# Patient Record
Sex: Male | Born: 1990 | Race: Black or African American | Hispanic: No | Marital: Single | Smoking: Current every day smoker
Health system: Southern US, Community
[De-identification: ages and names within clinical notes are randomized; demographics above are authoritative.]

---

## 2014-08-07 ENCOUNTER — Encounter (HOSPITAL_BASED_OUTPATIENT_CLINIC_OR_DEPARTMENT_OTHER): Payer: Self-pay | Admitting: *Deleted

## 2014-08-07 ENCOUNTER — Emergency Department (HOSPITAL_BASED_OUTPATIENT_CLINIC_OR_DEPARTMENT_OTHER)
Admission: EM | Admit: 2014-08-07 | Discharge: 2014-08-07 | Disposition: A | Discharge status: Home / Self Care | Payer: Self-pay | Attending: Emergency Medicine | Admitting: Emergency Medicine

## 2014-08-07 ENCOUNTER — Emergency Department (HOSPITAL_BASED_OUTPATIENT_CLINIC_OR_DEPARTMENT_OTHER): Payer: Self-pay

## 2014-08-07 DIAGNOSIS — K5900 Constipation, unspecified: Secondary | ICD-10-CM | POA: Insufficient documentation

## 2014-08-07 DIAGNOSIS — Z720 Tobacco use: Secondary | ICD-10-CM | POA: Insufficient documentation

## 2014-08-07 DIAGNOSIS — K920 Hematemesis: Secondary | ICD-10-CM | POA: Insufficient documentation

## 2014-08-07 DIAGNOSIS — R109 Unspecified abdominal pain: Secondary | ICD-10-CM

## 2014-08-07 LAB — CBC WITH DIFFERENTIAL/PLATELET
Basophils Absolute: 0 10*3/uL (ref 0.0–0.1)
Basophils Relative: 1 % (ref 0–1)
EOS PCT: 1 % (ref 0–5)
Eosinophils Absolute: 0 10*3/uL (ref 0.0–0.7)
HCT: 49.9 % (ref 39.0–52.0)
Hemoglobin: 17.6 g/dL — ABNORMAL HIGH (ref 13.0–17.0)
LYMPHS PCT: 31 % (ref 12–46)
Lymphs Abs: 1.7 10*3/uL (ref 0.7–4.0)
MCH: 29.4 pg (ref 26.0–34.0)
MCHC: 35.3 g/dL (ref 30.0–36.0)
MCV: 83.3 fL (ref 78.0–100.0)
Monocytes Absolute: 0.3 10*3/uL (ref 0.1–1.0)
Monocytes Relative: 6 % (ref 3–12)
Neutro Abs: 3.5 10*3/uL (ref 1.7–7.7)
Neutrophils Relative %: 61 % (ref 43–77)
PLATELETS: 221 10*3/uL (ref 150–400)
RBC: 5.99 MIL/uL — AB (ref 4.22–5.81)
RDW: 13.5 % (ref 11.5–15.5)
WBC: 5.6 10*3/uL (ref 4.0–10.5)

## 2014-08-07 LAB — COMPREHENSIVE METABOLIC PANEL
ALK PHOS: 76 U/L (ref 39–117)
ALT: 22 U/L (ref 0–53)
ANION GAP: 7 (ref 5–15)
AST: 23 U/L (ref 0–37)
Albumin: 4.8 g/dL (ref 3.5–5.2)
BUN: 7 mg/dL (ref 6–23)
CO2: 26 mmol/L (ref 19–32)
Calcium: 9.4 mg/dL (ref 8.4–10.5)
Chloride: 107 mmol/L (ref 96–112)
Creatinine, Ser: 0.84 mg/dL (ref 0.50–1.35)
GFR calc Af Amer: >90 mL/min (ref >90)
GLUCOSE: 104 mg/dL — AB (ref 70–99)
POTASSIUM: 4.1 mmol/L (ref 3.5–5.1)
SODIUM: 140 mmol/L (ref 135–145)
Total Bilirubin: 1.2 mg/dL (ref 0.3–1.2)
Total Protein: 8.4 g/dL — ABNORMAL HIGH (ref 6.0–8.3)

## 2014-08-07 LAB — LIPASE, BLOOD: Lipase: 28 U/L (ref 11–59)

## 2014-08-07 MED ORDER — PANTOPRAZOLE SODIUM 20 MG PO TBEC: 20.0000 mg | DELAYED_RELEASE_TABLET | Freq: Every day | ORAL | Status: AC

## 2014-08-07 MED ORDER — PANTOPRAZOLE SODIUM 40 MG IV SOLR
40.0000 mg | Freq: Once | INTRAVENOUS | Status: AC
  Administered 2014-08-07: 40 mg via INTRAVENOUS
  Filled 2014-08-07: qty 40

## 2014-08-07 MED ORDER — SODIUM CHLORIDE 0.9 % IV BOLUS (SEPSIS)
1000.0000 mL | Freq: Once | INTRAVENOUS | Status: AC
  Administered 2014-08-07: 1000 mL via INTRAVENOUS

## 2014-08-07 MED ORDER — GI COCKTAIL ~~LOC~~
30.0000 mL | Freq: Once | ORAL | Status: AC
  Administered 2014-08-07: 30 mL via ORAL
  Filled 2014-08-07: qty 30

## 2014-08-07 MED ORDER — SUCRALFATE 1 G PO TABS: 1.0000 g | ORAL_TABLET | Freq: Three times a day (TID) | ORAL | Status: AC

## 2014-08-07 NOTE — ED Notes (Addendum)
Pt reports vomiting bright red blood and epigastric pain x 2 weeks.  Constipation x 2 weeks.  LBM 'about 2 weeks ago'

## 2014-08-07 NOTE — ED Provider Notes (Signed)
CSN: [id number]     Arrival date & time 08/07/14  1043 History   First MD Initiated Contact with Patient 08/07/14 1051     Chief Complaint  Patient presents with  . Hematemesis     (Consider location/radiation/quality/duration/timing/severity/associated sxs/prior Treatment) HPI Tyler Gentry is a 24 y.o. male with history of acid reflux who presents to emergency department complaining of left upper quadrant abdominal pain, nausea, hematemesis, constipation. Patient states his symptoms started 2 weeks ago. States pain is usually worse in the morning. He states he has episode of emesis every single morning independent of eating. States every day he sees bright red blood in his emesis, states "it's a lot of blood and sometimes chunks." He also states he has not had a bowel movement in these 2 weeks. He states that sometimes the pain is completely gone, but most of the time it is there and it is worsening every day. He states he tried to take a laxative, states it was apparent that he got over-the-counter, which did not help him have a bowel movement. He denies taking for pain. He denies taking any for acid reflux. States the pain is worsened with eating, states he has not been eating but maybe once a day for the last several weeks because of the pain and nausea. Patient denies changing his diet. He denies any spicy foods. Denies taking NSAID medications. He is a smoker. Only occasional alcohol  History reviewed. No pertinent past medical history. History reviewed. No pertinent past surgical history. History reviewed. No pertinent family history. History  Substance Use Topics  . Smoking status: Current Every Day Smoker -- 0.50 packs/day    Types: Cigarettes  . Smokeless tobacco: Not on file  . Alcohol Use: No    Review of Systems  Constitutional: Negative for fever and chills.  Respiratory: Negative for cough, chest tightness and shortness of breath.   Cardiovascular: Negative for chest  pain, palpitations and leg swelling.  Gastrointestinal: Positive for nausea, vomiting, abdominal pain and constipation. Negative for diarrhea, blood in stool and abdominal distention.  Genitourinary: Negative for dysuria, urgency, frequency and hematuria.  Musculoskeletal: Negative for myalgias, arthralgias, neck pain and neck stiffness.  Skin: Negative for rash.  Allergic/Immunologic: Negative for immunocompromised state.  Neurological: Negative for dizziness, weakness, light-headedness, numbness and headaches.  All other systems reviewed and are negative.     Allergies  Review of patient's allergies indicates no known allergies.  Home Medications   Prior to Admission medications   Not on File   BP 150/83 mmHg  Pulse 62  Temp(Src) 98.7 F (37.1 C) (Oral)  Resp 18  Ht 6\' 2"  (1.88 m)  Wt 235 lb (106.595 kg)  BMI 30.16 kg/m2  SpO2 100% Physical Exam  Constitutional: He appears well-developed and well-nourished. No distress.  HENT:  Head: Normocephalic and atraumatic.  Eyes: Conjunctivae are normal.  Neck: Neck supple.  Cardiovascular: Normal rate, regular rhythm and normal heart sounds.   Pulmonary/Chest: Effort normal. No respiratory distress. He has no wheezes. He has no rales.  Abdominal: Soft. Bowel sounds are normal. He exhibits no distension. There is tenderness. There is no rebound and no guarding.  LUQ tenderness  Musculoskeletal: He exhibits no edema.  Neurological: He is alert.  Skin: Skin is warm and dry.  Nursing note and vitals reviewed.   ED Course  Procedures (including critical care time) Labs Review Labs Reviewed  CBC WITH DIFFERENTIAL/PLATELET - Abnormal; Notable for the following:    RBC 5.99 (*)  Hemoglobin 17.6 (*)    All other components within normal limits  COMPREHENSIVE METABOLIC PANEL - Abnormal; Notable for the following:    Glucose, Bld 104 (*)    Total Protein 8.4 (*)    All other components within normal limits  LIPASE, BLOOD     Imaging Review Dg Abd 2 Views  08/07/2014   CLINICAL DATA:  Hematemesis. Nausea and vomiting. Left upper quadrant abdominal pain.  EXAM: ABDOMEN - 2 VIEW  COMPARISON:  None.  FINDINGS: The bowel gas pattern is normal. There is no evidence of free air. No radio-opaque calculi or other significant radiographic abnormality is seen.  IMPRESSION: Negative.   Electronically Signed   By: Gaylyn Rong M.D.   On: 08/07/2014 13:16     EKG Interpretation None      MDM   Final diagnoses:  Abdominal pain   patient is otherwise healthy 24 year old, here with epigastric pain, hematemesis for the last 2 weeks. He also complaining of constipation, no bowel movement for 2 weeks. Patient's exam is consistent with gastritis versus PUD. His abdomen is soft, no guarding, doubt surgical abdomen. We will check labs including H&H, lipase, LFTs. Will try GI cocktail and order Protonix.  2:49 PM Patient is feeling much better after GI cocktail protonic IV fluids. His labs unremarkable, H&H are normal. Normal LFTs and lipase. Abdomen is benign. At this time I think patient is stable for discharge home with no further imaging needed on an emergent basis. I will refer him to gastroenterologist. Protonix and Carafate. Started to stop smoking. No NSAIDs. Follow-up as soon as able. We'll ask for constipation. Return precautions discussed. Patient was able to tolerate by mouth fluids in emergency department.  Filed Vitals:   08/07/14 1048 08/07/14 1342  BP: 150/83 110/45  Pulse: 62 63  Temp: 98.7 F (37.1 C)   TempSrc: Oral   Resp: 18 17  Height:  (1.88 m)   Weight: 235 lb (106.595 kg)   SpO2: 100% 100%     Jaynie Crumble, PA-C 08/07/14 2234  Gerhard Munch, MD 08/14/14 [phone]

## 2014-08-07 NOTE — ED Notes (Signed)
Patient transported to X-ray 

## 2014-08-07 NOTE — Discharge Instructions (Signed)
Start taking Protonix daily. Take Carafate as needed. Avoid any spicy foods. Avoid any NSAID medications. Stop smoking. Follow-up with gastroenterologist as referred. You can also get Miralax sold over-the-counter and take it 1-2 times a day for constipation.  Return if symptoms are worsening.  Peptic Ulcer A peptic ulcer is a sore in the lining of your esophagus (esophageal ulcer), stomach (gastric ulcer), or in the first part of your small intestine (duodenal ulcer). The ulcer causes erosion into the deeper tissue. CAUSES  Normally, the lining of the stomach and the small intestine protects itself from the acid that digests food. The protective lining can be damaged by:  An infection caused by a bacterium called Helicobacter pylori (H. pylori).  Regular use of nonsteroidal anti-inflammatory drugs (NSAIDs), such as ibuprofen or aspirin.  Smoking tobacco. Other risk factors include being older than 50, drinking alcohol excessively, and having a family history of ulcer disease.  SYMPTOMS   Burning pain or gnawing in the area between the chest and the belly button.  Heartburn.  Nausea and vomiting.  Bloating. The pain can be worse on an empty stomach and at night. If the ulcer results in bleeding, it can cause:  Black, tarry stools.  Vomiting of bright red blood.  Vomiting of coffee-ground-looking materials. DIAGNOSIS  A diagnosis is usually made based upon your history and an exam. Other tests and procedures may be performed to find the cause of the ulcer. Finding a cause will help determine the best treatment. Tests and procedures may include:  Blood tests, stool tests, or breath tests to check for the bacterium H. pylori.  An upper gastrointestinal (GI) series of the esophagus, stomach, and small intestine.  An endoscopy to examine the esophagus, stomach, and small intestine.  A biopsy. TREATMENT  Treatment may include:  Eliminating the cause of the ulcer, such as  smoking, NSAIDs, or alcohol.  Medicines to reduce the amount of acid in your digestive tract.  Antibiotic medicines if the ulcer is caused by the H. pylori bacterium.  An upper endoscopy to treat a bleeding ulcer.  Surgery if the bleeding is severe or if the ulcer created a hole somewhere in the digestive system. HOME CARE INSTRUCTIONS   Avoid tobacco, alcohol, and caffeine. Smoking can increase the acid in the stomach, and continued smoking will impair the healing of ulcers.  Avoid foods and drinks that seem to cause discomfort or aggravate your ulcer.  Only take medicines as directed by your caregiver. Do not substitute over-the-counter medicines for prescription medicines without talking to your caregiver.  Keep any follow-up appointments and tests as directed. SEEK MEDICAL CARE IF:   Your do not improve within 7 days of starting treatment.  You have ongoing indigestion or heartburn. SEEK IMMEDIATE MEDICAL CARE IF:   You have sudden, sharp, or persistent abdominal pain.  You have bloody or dark black, tarry stools.  You vomit blood or vomit that looks like coffee grounds.  You become light-headed, weak, or feel faint.  You become sweaty or clammy. MAKE SURE YOU:   Understand these instructions.  Will watch your condition.  Will get help right away if you are not doing well or get worse. Document Released: 03/24/2000 Document Revised: 08/11/2013 Document Reviewed: 10/25/2011 [facility] Patient Information 2015 [place]. This information is not intended to replace advice given to you by your health care provider. Make sure you discuss any questions you have with your health care provider.  Constipation Constipation is when a person has  fewer than three bowel movements a week, has difficulty having a bowel movement, or has stools that are dry, hard, or larger than normal. As people grow older, constipation is more common. If you try to fix constipation with medicines  that make you have a bowel movement (laxatives), the problem may get worse. Long-term laxative use may cause the muscles of the colon to become weak. A low-fiber diet, not taking in enough fluids, and taking certain medicines may make constipation worse.  CAUSES   Certain medicines, such as antidepressants, pain medicine, iron supplements, antacids, and water pills.   Certain diseases, such as diabetes, irritable bowel syndrome (IBS), thyroid disease, or depression.   Not drinking enough water.   Not eating enough fiber-rich foods.   Stress or travel.   Lack of physical activity or exercise.   Ignoring the urge to have a bowel movement.   Using laxatives too much.  SIGNS AND SYMPTOMS   Having fewer than three bowel movements a week.   Straining to have a bowel movement.   Having stools that are hard, dry, or larger than normal.   Feeling full or bloated.   Pain in the lower abdomen.   Not feeling relief after having a bowel movement.  DIAGNOSIS  Your health care provider will take a medical history and perform a physical exam. Further testing may be done for severe constipation. Some tests may include:  A barium enema X-ray to examine your rectum, colon, and, sometimes, your small intestine.   A sigmoidoscopy to examine your lower colon.   A colonoscopy to examine your entire colon. TREATMENT  Treatment will depend on the severity of your constipation and what is causing it. Some dietary treatments include drinking more fluids and eating more fiber-rich foods. Lifestyle treatments may include regular exercise. If these diet and lifestyle recommendations do not help, your health care provider may recommend taking over-the-counter laxative medicines to help you have bowel movements. Prescription medicines may be prescribed if over-the-counter medicines do not work.  HOME CARE INSTRUCTIONS   Eat foods that have a lot of fiber, such as fruits, vegetables, whole  grains, and beans.  Limit foods high in fat and processed sugars, such as french fries, hamburgers, cookies, candies, and soda.   A fiber supplement may be added to your diet if you cannot get enough fiber from foods.   Drink enough fluids to keep your urine clear or pale yellow.   Exercise regularly or as directed by your health care provider.   Go to the restroom when you have the urge to go. Do not hold it.   Only take over-the-counter or prescription medicines as directed by your health care provider. Do not take other medicines for constipation without talking to your health care provider first.  SEEK IMMEDIATE MEDICAL CARE IF:   You have bright red blood in your stool.   Your constipation lasts for more than 4 days or gets worse.   You have abdominal or rectal pain.   You have thin, pencil-like stools.   You have unexplained weight loss. MAKE SURE YOU:   Understand these instructions.  Will watch your condition.  Will get help right away if you are not doing well or get worse. Document Released: 12/24/2003 Document Revised: 04/01/2013 Document Reviewed: 01/06/2013 [facility] Patient Information 2015 [place], [place]. This information is not intended to replace advice given to you by your health care provider. Make sure you discuss any questions you have with your health care  provider.

## 2018-12-13 ENCOUNTER — Ambulatory Visit (HOSPITAL_COMMUNITY)
Admission: EM | Admit: 2018-12-13 | Discharge: 2018-12-13 | Disposition: A | Discharge status: Home / Self Care | Payer: Self-pay | Attending: Family Medicine | Admitting: Family Medicine

## 2018-12-13 ENCOUNTER — Encounter (HOSPITAL_COMMUNITY): Payer: Self-pay | Admitting: Emergency Medicine

## 2018-12-13 DIAGNOSIS — H1031 Unspecified acute conjunctivitis, right eye: Secondary | ICD-10-CM

## 2018-12-13 MED ORDER — ERYTHROMYCIN 5 MG/GM OP OINT: 1.0000 "application " | TOPICAL_OINTMENT | Freq: Four times a day (QID) | OPHTHALMIC | 0 refills | Status: AC

## 2018-12-13 NOTE — ED Provider Notes (Signed)
[facility]    CSN: [id number] Arrival date & time: 12/13/18  1108      History   Chief Complaint Chief Complaint  Patient presents with  . Eye Problem    HPI Tyler OkQuaneDaris Hoe is a 28 y.o. male.   Tyler Gentry presents with complaints of right eye redness, tearing, photophobia and pain. No foreign body sensation. No injury. No chemical or exposure to the eye. Denies any previous similar. No headache. Vision feels blurred. No nausea or vomiting. No purulence. Doesn't wear contacts or glasses. Denies any previous similar. No URI symptoms. No itching.    ROS per HPI, negative if not otherwise mentioned.      History reviewed. No pertinent past medical history.  There are no active problems to display for this patient.   History reviewed. No pertinent surgical history.     Home Medications    Prior to Admission medications   Medication Sig Start Date End Date Taking? Authorizing Provider  erythromycin ophthalmic ointment Place 1 application into the right eye 4 (four) times daily for 7 days. 12/13/18 12/20/18  Georgetta HaberBurky, Natalie B, NP  pantoprazole (PROTONIX) 20 MG tablet Take 1 tablet (20 mg total) by mouth daily. 08/07/14   Kirichenko, Tatyana, PA-C  sucralfate (CARAFATE) 1 G tablet Take 1 tablet (1 g total) by mouth 4 (four) times daily -  with meals and at bedtime. 08/07/14   Jaynie CrumbleKirichenko, Tatyana, PA-C    Family History No family history on file.  Social History Social History   Tobacco Use  . Smoking status: Current Every Day Smoker    Packs/day: 0.50    Types: Cigarettes  Substance Use Topics  . Alcohol use: No  . Drug use: No     Allergies   Patient has no known allergies.   Review of Systems Review of Systems   Physical Exam Triage Vital Signs ED Triage Vitals [12/13/18 1124]  Enc Vitals Group     BP (!) 142/80     Pulse Rate (!) 58     Resp 16     Temp (!) 97 F (36.1 C)     Temp src      SpO2 100 %     Weight      Height       Head Circumference      Peak Flow      Pain Score 7     Pain Loc      Pain Edu?      Excl. in GC?    No data found.  Updated Vital Signs BP (!) 142/80   Pulse (!) 58   Temp (!) 97 F (36.1 C)   Resp 16   SpO2 100%   Visual Acuity Right Eye Distance: 20/200 Left Eye Distance: 20/70 Bilateral Distance: 20/70  Right Eye Near:   Left Eye Near:    Bilateral Near:     Physical Exam Constitutional:      Appearance: He is well-developed.  Eyes:     General: Lids are normal.        Right eye: No foreign body.     Extraocular Movements: Extraocular movements intact.     Conjunctiva/sclera:     Right eye: Right conjunctiva is injected.     Pupils: Pupils are equal, round, and reactive to light.     Right eye: Pupil is not sluggish. No fluorescein uptake.     Comments: Excess clear tearing from right eye; notable photophobia which improved s/p  tetracaine administration; grossly red; minimal pain with movement  Cardiovascular:     Rate and Rhythm: Normal rate.  Pulmonary:     Effort: Pulmonary effort is normal.  Skin:    General: Skin is warm and dry.  Neurological:     Mental Status: He is alert and oriented to person, place, and time.      UC Treatments / Results  Labs (all labs ordered are listed, but only abnormal results are displayed) Labs Reviewed - No data to display  EKG   Radiology No results found.  Procedures Procedures (including critical care time)  Medications Ordered in UC Medications - No data to display  Initial Impression / Assessment and Plan / UC Course  I have reviewed the triage vital signs and the nursing notes.  Pertinent labs & imaging results that were available during my care of the patient were reviewed by me and considered in my medical decision making (see chart for details).     Noted decreased acuity to right eye with photophobia. Symptoms did improve with tetracaine. Doesn't wear contacts. No fluorescein findings. No  headache or nausea. Erythromycin provided and encouraged follow up with eye today or tomorrow for recheck and further eval. Patient verbalized understanding and agreeable to plan.  Ambulatory out of clinic without difficulty.    Final Clinical Impressions(s) / UC Diagnoses   Final diagnoses:  Acute conjunctivitis of right eye, unspecified acute conjunctivitis type     Discharge Instructions     Use of eye ointment as prescribed.  Please follow up with an eye doctor, call today for recheck today or tomorrow Avoid rubbing the eye as able.  Any worsening of symptoms- worsening of pain, loss of vision or otherwise worsening please return or go to the ER.    ED Prescriptions    Medication Sig Dispense Auth. Provider   erythromycin ophthalmic ointment Place 1 application into the right eye 4 (four) times daily for 7 days. 28 g Augusto Gamble B, NP     Controlled Substance Prescriptions [place] Controlled Substance Registry consulted? Not Applicable   Zigmund Gottron, NP 12/13/18 1201

## 2018-12-13 NOTE — ED Triage Notes (Signed)
Pt states "I want to be sure I dont have pink eye". Pt states when the sun hits it, it hurts. Pt c/o drainage.

## 2018-12-13 NOTE — Discharge Instructions (Addendum)
Use of eye ointment as prescribed.  Please follow up with an eye doctor, call today for recheck today or tomorrow Avoid rubbing the eye as able.  Any worsening of symptoms- worsening of pain, loss of vision or otherwise worsening please return or go to the ER.

## 2019-12-29 ENCOUNTER — Emergency Department (HOSPITAL_COMMUNITY)
Admission: EM | Admit: 2019-12-29 | Discharge: 2019-12-30 | Disposition: A | Discharge status: Home / Self Care | Payer: Self-pay | Attending: Emergency Medicine | Admitting: Emergency Medicine

## 2019-12-29 ENCOUNTER — Other Ambulatory Visit: Payer: Self-pay

## 2019-12-29 ENCOUNTER — Emergency Department (HOSPITAL_COMMUNITY): Payer: Self-pay

## 2019-12-29 ENCOUNTER — Encounter (HOSPITAL_COMMUNITY): Payer: Self-pay | Admitting: Emergency Medicine

## 2019-12-29 DIAGNOSIS — F17210 Nicotine dependence, cigarettes, uncomplicated: Secondary | ICD-10-CM | POA: Insufficient documentation

## 2019-12-29 DIAGNOSIS — S0101XA Laceration without foreign body of scalp, initial encounter: Secondary | ICD-10-CM | POA: Insufficient documentation

## 2019-12-29 DIAGNOSIS — R42 Dizziness and giddiness: Secondary | ICD-10-CM | POA: Insufficient documentation

## 2019-12-29 DIAGNOSIS — S20319A Abrasion of unspecified front wall of thorax, initial encounter: Secondary | ICD-10-CM | POA: Insufficient documentation

## 2019-12-29 DIAGNOSIS — S20219A Contusion of unspecified front wall of thorax, initial encounter: Secondary | ICD-10-CM | POA: Insufficient documentation

## 2019-12-29 DIAGNOSIS — W2111XA Struck by baseball bat, initial encounter: Secondary | ICD-10-CM | POA: Insufficient documentation
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
## 2019-12-30 ENCOUNTER — Emergency Department (HOSPITAL_COMMUNITY): Payer: Self-pay

## 2019-12-30 MED ORDER — KETOROLAC TROMETHAMINE 60 MG/2ML IM SOLN
60.0000 mg | Freq: Once | INTRAMUSCULAR | Status: AC
  Administered 2019-12-30: 60 mg via INTRAMUSCULAR
  Filled 2019-12-30: qty 2

## 2019-12-30 MED ORDER — LIDOCAINE HCL (PF) 1 % IJ SOLN
5.0000 mL | Freq: Once | INTRAMUSCULAR | Status: AC
  Administered 2019-12-30: 5 mL
  Filled 2019-12-30: qty 5

## 2019-12-30 NOTE — ED Notes (Signed)
Pt d/c home per MD order. Discharge summary reviewed with pt, pt verbalizes understanding. Ambulatory off unit. No s/s of acute stress noted, reports discharge ride home.
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
## 2019-12-30 NOTE — Discharge Instructions (Addendum)
Keep area clean by washing with soap and water daily. Do not submerge in water or scrub stitches Apply a bandage at least once daily, change more often if it is dirty Watch for signs of infection (redness, drainage, worsening pain) Take Tylenol or Ibuprofen for pain as needed Have staples  removed in 7-10 days  You may experience signs of a concussion: Avoid alcohol and screen time for the next  few days and increase as tolerated. Please have someone stay with you tonight and monitor your mental status. If you become excessively sleepy, experience nausea, vomitting, worsening chest pain, shortness of breath, etc return to the ER   Rest your brain and body for a few days after your injury. Gradually return to activities Get plenty of sleep, and avoid alcohol and opioid pain medicines while recovering from a concussion.

## 2020-12-16 ENCOUNTER — Other Ambulatory Visit: Payer: Self-pay

## 2020-12-16 ENCOUNTER — Emergency Department (HOSPITAL_COMMUNITY)
Admission: EM | Admit: 2020-12-16 | Discharge: 2020-12-16 | Disposition: A | Discharge status: Home / Self Care | Payer: Medicaid Other | Attending: Emergency Medicine | Admitting: Emergency Medicine

## 2020-12-16 DIAGNOSIS — B349 Viral infection, unspecified: Secondary | ICD-10-CM

## 2020-12-16 DIAGNOSIS — M791 Myalgia, unspecified site: Secondary | ICD-10-CM | POA: Insufficient documentation

## 2020-12-16 DIAGNOSIS — R519 Headache, unspecified: Secondary | ICD-10-CM | POA: Insufficient documentation

## 2020-12-16 DIAGNOSIS — R110 Nausea: Secondary | ICD-10-CM | POA: Insufficient documentation

## 2020-12-16 DIAGNOSIS — F17210 Nicotine dependence, cigarettes, uncomplicated: Secondary | ICD-10-CM | POA: Insufficient documentation

## 2020-12-16 DIAGNOSIS — R0981 Nasal congestion: Secondary | ICD-10-CM | POA: Insufficient documentation

## 2020-12-16 DIAGNOSIS — Z20822 Contact with and (suspected) exposure to covid-19: Secondary | ICD-10-CM | POA: Insufficient documentation

## 2020-12-16 LAB — SARS CORONAVIRUS 2 (TAT 6-24 HRS): SARS Coronavirus 2: NEGATIVE

## 2020-12-16 NOTE — Discharge Instructions (Addendum)
May return to work in 5 days.  COVID test will result on MyChart, if negative he can return back sooner if you are feeling better.  If positive please stay at home.  Take Tylenol Motrin as needed for body aches.  Return if you have difficulty breathing or chest pain.

## 2020-12-16 NOTE — ED Triage Notes (Signed)
C/O headache, congestion and URI symptoms x 2 days. Denies PMH. + covid vaccine

## 2020-12-16 NOTE — ED Provider Notes (Signed)
[facility] EMERGENCY DEPARTMENT Provider Note   CSN: [id number] Arrival date & time: 12/16/20  6295     History Chief Complaint  Patient presents with   Headache    Tyler Gentry is a 30 y.o. male.  HPI   Patient presents with 2 days of headache, body aches, nausea, congestion.  He is COVID vaccinated, some sick contacts at work.  Has not taken a COVID test yet.  He denies any shortness of breath or chest pain.  Has not tried any alleviating factors, breathing through his nose is an aggravating factor.  No chest pain or shortness of breath.  He has not had any difficulty breathing at home, no history of asthma.   No past medical history on file.  There are no problems to display for this patient.   No past surgical history on file.     No family history on file.  Social History   Tobacco Use   Smoking status: Every Day    Packs/day: 0.50    Types: Cigarettes   Smokeless tobacco: Never  Substance Use Topics   Alcohol use: No   Drug use: No    Home Medications Prior to Admission medications   Medication Sig Start Date End Date Taking? Authorizing Provider  pantoprazole (PROTONIX) 20 MG tablet Take 1 tablet (20 mg total) by mouth daily. 08/07/14   Kirichenko, Tatyana, PA-C  sucralfate (CARAFATE) 1 G tablet Take 1 tablet (1 g total) by mouth 4 (four) times daily -  with meals and at bedtime. 08/07/14   Jaynie Crumble, PA-C    Allergies    Patient has no known allergies.  Review of Systems   Review of Systems  All other systems reviewed and are negative.  Physical Exam Updated Vital Signs BP 125/79 (BP Location: Right Arm)   Pulse 87   Temp 99 F (37.2 C) (Oral)   Resp 17   Ht 6\' 1"  (1.854 m)   Wt 117.9 kg   SpO2 98%   BMI 34.30 kg/m   Physical Exam Vitals and nursing note reviewed. Exam conducted with a chaperone present.  Constitutional:      General: He is not in acute distress.    Appearance: Normal appearance.  HENT:      Head: Normocephalic and atraumatic.     Nose: Congestion present.     Right Turbinates: Swollen.     Left Turbinates: Swollen.     Mouth/Throat:     Mouth: Mucous membranes are moist.     Pharynx: Posterior oropharyngeal erythema present.  Eyes:     General: No scleral icterus.    Extraocular Movements: Extraocular movements intact.     Pupils: Pupils are equal, round, and reactive to light.  Cardiovascular:     Rate and Rhythm: Normal rate and regular rhythm.  Pulmonary:     Effort: Pulmonary effort is normal.     Breath sounds: Normal breath sounds.     Comments: Lungs are clear to auscultation bilaterally, speaking complete sentences.  Satting 98% on room air without any tachypnea or accessory muscle use. Skin:    Coloration: Skin is not jaundiced.  Neurological:     Mental Status: He is alert. Mental status is at baseline.     Coordination: Coordination normal.    ED Results / Procedures / Treatments   Labs (all labs ordered are listed, but only abnormal results are displayed) Labs Reviewed - No data to display  EKG None  Radiology No  results found.  Procedures Procedures   Medications Ordered in ED Medications - No data to display  ED Course  I have reviewed the triage vital signs and the nursing notes.  Pertinent labs & imaging results that were available during my care of the patient were reviewed by me and considered in my medical decision making (see chart for details).    MDM Rules/Calculators/A&P                           Patient vitals are stable, no tachycardia or respiratory distress.  He is satting at 98% on room air, looks comfortable with physical exam findings consistent with a viral URI.  High suspicion for likely COVID, but could be any viral URI.  Will test for COVID and flu and have the patient do symptomatic management at home. Return precautions given, patient is appropriate for discharge at this time.  Final Clinical Impression(s) /  ED Diagnoses Final diagnoses:  None    Rx / DC Orders ED Discharge Orders     None        Theron Arista, [place] 12/16/20 1118    Tegeler, Canary Brim, MD 12/16/20 [phone]
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
## 2023-02-09 DIAGNOSIS — Z419 Encounter for procedure for purposes other than remedying health state, unspecified: Secondary | ICD-10-CM | POA: Diagnosis not present

## 2023-03-11 DIAGNOSIS — Z419 Encounter for procedure for purposes other than remedying health state, unspecified: Secondary | ICD-10-CM | POA: Diagnosis not present

## 2023-04-11 DIAGNOSIS — Z419 Encounter for procedure for purposes other than remedying health state, unspecified: Secondary | ICD-10-CM | POA: Diagnosis not present

## 2023-05-12 DIAGNOSIS — Z419 Encounter for procedure for purposes other than remedying health state, unspecified: Secondary | ICD-10-CM | POA: Diagnosis not present

## 2023-06-09 DIAGNOSIS — Z419 Encounter for procedure for purposes other than remedying health state, unspecified: Secondary | ICD-10-CM | POA: Diagnosis not present

## 2023-07-21 DIAGNOSIS — Z419 Encounter for procedure for purposes other than remedying health state, unspecified: Secondary | ICD-10-CM | POA: Diagnosis not present

## 2023-08-20 DIAGNOSIS — Z419 Encounter for procedure for purposes other than remedying health state, unspecified: Secondary | ICD-10-CM | POA: Diagnosis not present

## 2023-09-20 DIAGNOSIS — Z419 Encounter for procedure for purposes other than remedying health state, unspecified: Secondary | ICD-10-CM | POA: Diagnosis not present

## 2023-10-20 DIAGNOSIS — Z419 Encounter for procedure for purposes other than remedying health state, unspecified: Secondary | ICD-10-CM | POA: Diagnosis not present

## 2023-11-20 DIAGNOSIS — Z419 Encounter for procedure for purposes other than remedying health state, unspecified: Secondary | ICD-10-CM | POA: Diagnosis not present

## 2023-12-21 DIAGNOSIS — Z419 Encounter for procedure for purposes other than remedying health state, unspecified: Secondary | ICD-10-CM | POA: Diagnosis not present
